# Patient Record
Sex: Male | Born: 1985 | Race: White | Hispanic: No | Marital: Single | Smoking: Never smoker
Health system: Southern US, Community
[De-identification: ages and names within clinical notes are randomized; demographics above are authoritative.]

## PROBLEM LIST (undated history)

## (undated) HISTORY — PX: TONSILLECTOMY: SUR1361

---

## 2008-04-21 ENCOUNTER — Emergency Department: Payer: Self-pay | Admitting: Emergency Medicine

## 2008-04-22 ENCOUNTER — Other Ambulatory Visit: Payer: Self-pay

## 2009-08-31 ENCOUNTER — Emergency Department: Payer: Self-pay | Admitting: Emergency Medicine

## 2011-03-31 ENCOUNTER — Emergency Department: Payer: Self-pay | Admitting: Internal Medicine

## 2011-05-13 ENCOUNTER — Emergency Department: Payer: Self-pay | Admitting: Emergency Medicine

## 2015-06-24 ENCOUNTER — Emergency Department
Admission: EM | Admit: 2015-06-24 | Discharge: 2015-06-25 | Disposition: A | Discharge status: Home / Self Care | Payer: Commercial Indemnity | Attending: Emergency Medicine | Admitting: Emergency Medicine

## 2015-06-24 DIAGNOSIS — Z79899 Other long term (current) drug therapy: Secondary | ICD-10-CM | POA: Insufficient documentation

## 2015-06-24 DIAGNOSIS — M549 Dorsalgia, unspecified: Secondary | ICD-10-CM | POA: Diagnosis not present

## 2015-06-24 DIAGNOSIS — R42 Dizziness and giddiness: Secondary | ICD-10-CM | POA: Diagnosis present

## 2015-06-24 LAB — BASIC METABOLIC PANEL
Anion gap: 8 (ref 5–15)
BUN: 16 mg/dL (ref 6–20)
CHLORIDE: 103 mmol/L (ref 101–111)
CO2: 28 mmol/L (ref 22–32)
Calcium: 9.6 mg/dL (ref 8.9–10.3)
Creatinine, Ser: 1.18 mg/dL (ref 0.61–1.24)
GFR calc Af Amer: >60 mL/min (ref >60)
GFR calc non Af Amer: >60 mL/min (ref >60)
GLUCOSE: 98 mg/dL (ref 65–99)
POTASSIUM: 3.7 mmol/L (ref 3.5–5.1)
Sodium: 139 mmol/L (ref 135–145)

## 2015-06-24 LAB — CBC
HEMATOCRIT: 44.4 % (ref 40.0–52.0)
HEMOGLOBIN: 15.3 g/dL (ref 13.0–18.0)
MCH: 30.1 pg (ref 26.0–34.0)
MCHC: 34.4 g/dL (ref 32.0–36.0)
MCV: 87.5 fL (ref 80.0–100.0)
Platelets: 314 10*3/uL (ref 150–440)
RBC: 5.08 MIL/uL (ref 4.40–5.90)
RDW: 12.8 % (ref 11.5–14.5)
WBC: 10.2 10*3/uL (ref 3.8–10.6)

## 2015-06-24 NOTE — ED Notes (Signed)
Pt to triage from [facility] with reports of light headedness x 1 week. Sluggish pupils and not eating well.

## 2015-06-24 NOTE — ED Notes (Signed)
REports being diagnosed with vertigo on Monday of this week. Denies "Dizziness" currently, c/o "lightheadedness". States meclizine is not helping.

## 2015-06-24 NOTE — ED Notes (Signed)
Pt states had felt lightheaded and dizzy for one week. Pt states has had nausea. "i just don't feel right and i'm not eating very well."

## 2015-06-25 ENCOUNTER — Emergency Department: Payer: Commercial Indemnity

## 2015-06-25 LAB — URINALYSIS COMPLETE WITH MICROSCOPIC (ARMC ONLY)
Bacteria, UA: NONE SEEN
Bilirubin Urine: NEGATIVE
Glucose, UA: NEGATIVE mg/dL
Hgb urine dipstick: NEGATIVE
KETONES UR: NEGATIVE mg/dL
Leukocytes, UA: NEGATIVE
Nitrite: NEGATIVE
PROTEIN: NEGATIVE mg/dL
SQUAMOUS EPITHELIAL / LPF: NONE SEEN
Specific Gravity, Urine: 1.021 (ref 1.005–1.030)
pH: 7 (ref 5.0–8.0)

## 2015-06-25 MED ORDER — IOHEXOL 350 MG/ML SOLN
80.0000 mL | Freq: Once | INTRAVENOUS | Status: AC | PRN
  Administered 2015-06-25: 80 mL via INTRAVENOUS

## 2015-06-25 NOTE — ED Notes (Signed)
Pt and spouse sleeping. resps unlabored. Vss. Call bella t side.

## 2015-06-25 NOTE — Discharge Instructions (Signed)
Dizziness °Dizziness is a common problem. It is a feeling of unsteadiness or light-headedness. You may feel like you are about to faint. Dizziness can lead to injury if you stumble or fall. A person of any age group can suffer from dizziness, but dizziness is more common in older adults. °CAUSES  °Dizziness can be caused by many different things, including: °· Middle ear problems. °· Standing for too long. °· Infections. °· An allergic reaction. °· Aging. °· An emotional response to something, such as the sight of blood. °· Side effects of medicines. °· Tiredness. °· Problems with circulation or blood pressure. °· Excessive use of alcohol or medicines, or illegal drug use. °· Breathing too fast (hyperventilation). °· An irregular heart rhythm (arrhythmia). °· A low red blood cell count (anemia). °· Pregnancy. °· Vomiting, diarrhea, fever, or other illnesses that cause body fluid loss (dehydration). °· Diseases or conditions such as Parkinson's disease, high blood pressure (hypertension), diabetes, and thyroid problems. °· Exposure to extreme heat. °DIAGNOSIS  °Your health care provider will ask about your symptoms, perform a physical exam, and perform an electrocardiogram (ECG) to record the electrical activity of your heart. Your health care provider may also perform other heart or blood tests to determine the cause of your dizziness. These may include: °· Transthoracic echocardiogram (TTE). During echocardiography, sound waves are used to evaluate how blood flows through your heart. °· Transesophageal echocardiogram (TEE). °· Cardiac monitoring. This allows your health care provider to monitor your heart rate and rhythm in real time. °· Holter monitor. This is a portable device that records your heartbeat and can help diagnose heart arrhythmias. It allows your health care provider to track your heart activity for several days if needed. °· Stress tests by exercise or by giving medicine that makes the heart beat  faster. °TREATMENT  °Treatment of dizziness depends on the cause of your symptoms and can vary greatly. °HOME CARE INSTRUCTIONS  °· Drink enough fluids to keep your urine clear or pale yellow. This is especially important in very hot weather. In older adults, it is also important in cold weather. °· Take your medicine exactly as directed if your dizziness is caused by medicines. When taking blood pressure medicines, it is especially important to get up slowly. °¨ Rise slowly from chairs and steady yourself until you feel okay. °¨ In the morning, first sit up on the side of the bed. When you feel okay, stand slowly while holding onto something until you know your balance is fine. °· Move your legs often if you need to stand in one place for a long time. Tighten and relax your muscles in your legs while standing. °· Have someone stay with you for 1-2 days if dizziness continues to be a problem. Do this until you feel you are well enough to stay alone. Have the person call your health care provider if he or she notices changes in you that are concerning. °· Do not drive or use heavy machinery if you feel dizzy. °· Do not drink alcohol. °SEEK IMMEDIATE MEDICAL CARE IF:  °· Your dizziness or light-headedness gets worse. °· You feel nauseous or vomit. °· You have problems talking, walking, or using your arms, hands, or legs. °· You feel weak. °· You are not thinking clearly or you have trouble forming sentences. It may take a friend or family member to notice this. °· You have chest pain, abdominal pain, shortness of breath, or sweating. °· Your vision changes. °· You notice   any bleeding.  You have side effects from medicine that seems to be getting worse rather than better. MAKE SURE YOU:   Understand these instructions.  Will watch your condition.  Will get help right away if you are not doing well or get worse. Document Released: 03/20/2001 Document Revised: 09/29/2013 Document Reviewed: 04/13/2011 [facility]  Patient Information 2015 [place], [place]. This information is not intended to replace advice given to you by your health care provider. Make sure you discuss any questions you have with your health care provider. Rest and take it easy this weekend. Drink plenty of fluids. Return for worsening symptoms especially any fever. Follow-up with your doctor if not any better in the next 3 or 4 days.

## 2015-06-25 NOTE — ED Provider Notes (Signed)
[facility] Dietrick Barris Note  ____________________________________________  Time seen: Approximately 1:05 AM  I have reviewed the triage vital signs and the nursing notes.   HISTORY  Chief Complaint Dizziness    HPI Tommy Morales is a 29 y.o. male who complains of unsteadiness when he stands. He was diagnosed at the urgent care for a understand with vertigo on Monday given meclizine but is not helping. Patient reports he had sudden onset of neck pain the back of the neck and then the lightheadedness and unsteadiness came on. He has most of the neck pain and unsteadiness when he sits or stands. It gets better when he lays down and he does report that it did get worse at least once when he was seen in the urgent care with head movement. Patient also was seen in critical clinic and had pain on examination of the right mastoid area. Patient reports he feels like there is water sloshing around his ear. Patient denies any fever nausea vomiting or other problems.  History reviewed. No pertinent past medical history.  There are no active problems to display for this patient.   No past surgical history on file.  Current Outpatient Rx  Name  Route  Sig  Dispense  Refill  . erythromycin ophthalmic ointment            0   . ondansetron (ZOFRAN-ODT) 8 MG disintegrating tablet      DIS 1 T ON THE TONGUE Q 8 H      0     Allergies Review of patient's allergies indicates not on file.  No family history on file.  Social History Social History  Substance Use Topics  . Smoking status: None  . Smokeless tobacco: None  . Alcohol Use: None    Review of Systems Constitutional: No fever/chills Eyes: No visual changes. ENT: No sore throat. Cardiovascular: Denies chest pain. Respiratory: Denies shortness of breath. Gastrointestinal: No abdominal pain.  No nausea, no vomiting.  No diarrhea.  No constipation. Genitourinary: Negative for  dysuria. Musculoskeletal: Negative for back pain. Skin: Negative for rash. Neurological: Negative for headaches, focal weakness or numbness.  10-point ROS otherwise negative.  ____________________________________________   PHYSICAL EXAM:  VITAL SIGNS: ED Triage Vitals  Enc Vitals Group     BP 06/24/15 1859 129/99 mmHg     Pulse Rate 06/24/15 1859 66     Resp 06/24/15 1859 18     Temp 06/24/15 1859 98.3 F (36.8 C)     Temp Source 06/24/15 1859 Oral     SpO2 06/24/15 1859 99 %     Weight 06/24/15 1859 193 lb (87.544 kg)     Height 06/24/15 1859  (1.854 m)     Head Cir --      Peak Flow --      Pain Score 06/24/15 2303 8     Pain Loc --      Pain Edu? --      Excl. in GC? --     Constitutional: Alert and oriented. Well appearing and in no acute distress. Eyes: Conjunctivae are normal. PERRL. EOMI. Head: Atraumatic. Nose: No congestion/rhinnorhea. Mouth/Throat: Mucous membranes are moist.  Oropharynx non-erythematous. Neck: No stridor No cervical spine tenderness to palpation. There is paraspinal tenderness. Hematological/Lymphatic/Immunilogical: No cervical lymphadenopathy. Cardiovascular: Normal rate, regular rhythm. Grossly normal heart sounds.  Good peripheral circulation. Respiratory: Normal respiratory effort.  No retractions. Lungs CTAB. Gastrointestinal: Soft and nontender. No distention. No abdominal bruits.  No CVA tenderness. Musculoskeletal: No lower extremity tenderness nor edema.  No joint effusions. Neurologic:  Normal speech and language. No gross focal neurologic deficits are appreciated. No gait instability visualized on the patient reports he feels very unsteady when he stands.. Cranial nerves II through XII are intact. Cerebellar finger-nose rapid alternating movements of the fingers and heel-to-shin are normal bilaterally. Motor strength is 5 over 5 throughout. Sensation is intact throughout. Skin:  Skin is warm, dry and intact. No rash  noted. Psychiatric: Mood and affect are normal. Speech and behavior are normal.  ____________________________________________   LABS (all labs ordered are listed, but only abnormal results are displayed)  Labs Reviewed  URINALYSIS COMPLETEWITH MICROSCOPIC ([facility] ONLY) - Abnormal; Notable for the following:    Color, Urine YELLOW (*)    APPearance CLEAR (*)    All other components within normal limits  BASIC METABOLIC PANEL  CBC   ____________________________________________  EKG  EKG read and interpreted by me shows normal sinus rhythm with a rate of 69. Normal axis essentially normal EKG ____________________________________________  RADIOLOGY  CT of the head and CT angiogram of the neck showed no acute pathology ____________________________________________   PROCEDURES    ____________________________________________   INITIAL IMPRESSION / ASSESSMENT AND PLAN / ED COURSE  Pertinent labs & imaging results that were available during my care of the patient were reviewed by me and considered in my medical decision making (see chart for details).  Discussed with patient and family at length the fact that all the tests I have are normal not quite sure what's going on with him please follow-up with his doctor return if he is worse ____________________________________________   FINAL CLINICAL IMPRESSION(S) / ED DIAGNOSES  Final diagnoses:  Dizziness      Arnaldo Natal, MD 06/25/15 [phone]

## 2015-06-25 NOTE — ED Notes (Signed)
Call bell at side. Warm blankets provided to visitors.

## 2015-07-07 ENCOUNTER — Other Ambulatory Visit: Payer: Self-pay | Admitting: Otolaryngology

## 2015-07-07 DIAGNOSIS — R42 Dizziness and giddiness: Secondary | ICD-10-CM

## 2015-07-14 ENCOUNTER — Ambulatory Visit
Admission: RE | Admit: 2015-07-14 | Discharge: 2015-07-14 | Disposition: A | Discharge status: Home / Self Care | Payer: Commercial Indemnity | Source: Ambulatory Visit | Attending: Otolaryngology | Admitting: Otolaryngology

## 2015-07-14 DIAGNOSIS — R42 Dizziness and giddiness: Secondary | ICD-10-CM | POA: Insufficient documentation

## 2015-07-14 MED ORDER — GADOBENATE DIMEGLUMINE 529 MG/ML IV SOLN
15.0000 mL | Freq: Once | INTRAVENOUS | Status: AC | PRN
  Administered 2015-07-14: 15 mL via INTRAVENOUS

## 2015-09-12 ENCOUNTER — Ambulatory Visit: Payer: Commercial Indemnity | Admitting: Neurology

## 2015-09-26 ENCOUNTER — Ambulatory Visit: Payer: Commercial Indemnity | Admitting: Neurology

## 2016-06-12 IMAGING — MR MR BRAIN/IAC WO/W
7 of 11 series · 33 of 48 positions shown · IV contrast (multihance)
Comparison: 06/25/2015 head and neck CTA

CLINICAL DATA: Dizziness. Posterior headaches, lightheadedness, and
vertigo for 3-4 weeks.

EXAM:
MR BRAIN/IAC WITHOUT AND WITH CONTRAST
TECHNIQUE: Multiplanar, multisequence MR imaging was performed both before and
after administration of intravenous contrast.
CONTRAST:  15mL MULTIHANCE GADOBENATE DIMEGLUMINE 529 MG/ML IV SOLN

[Series 2: T1 · sagittal · 5.0mm · 0.45mm/px · 5 of 29 slices shown (1 of 2)]
[im 1/29]
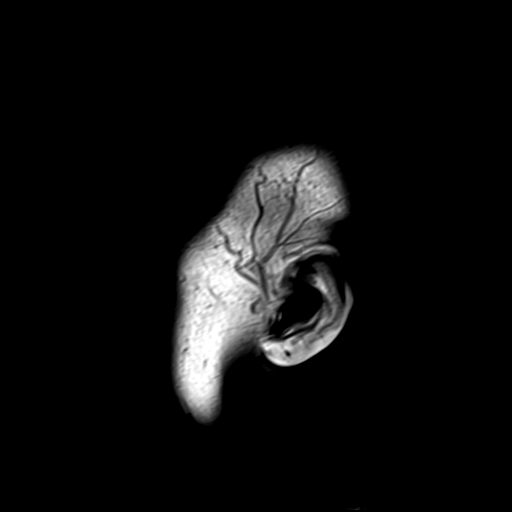
[im 8/29]
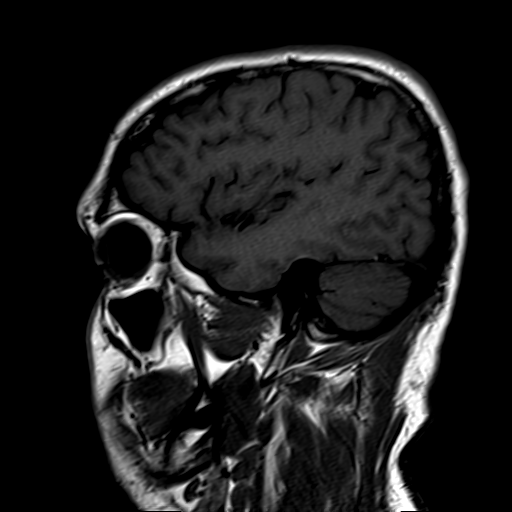
[im 15/29]
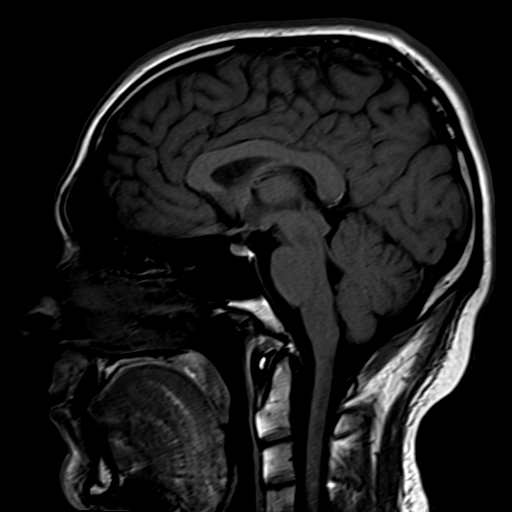
[im 22/29]
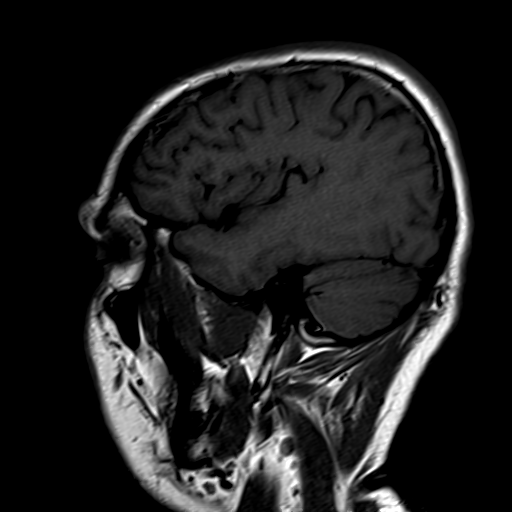
[im 29/29]
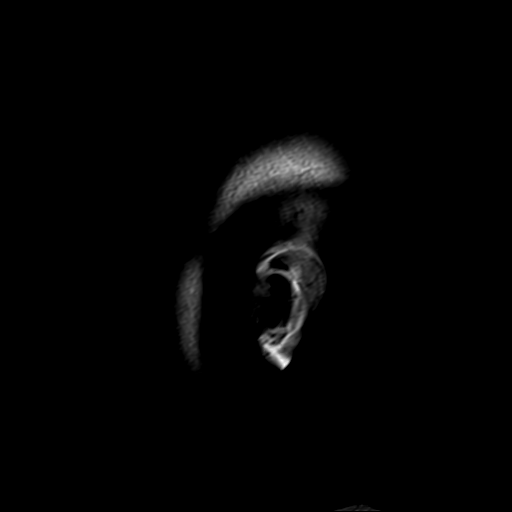

[Series 4: DWI · axial · 3.0mm · 1.80mm/px · z∈[-61,+98]mm · 6 of 41 slices shown (1 of 2)]
[im 1/41]
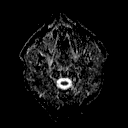
[im 9/41]
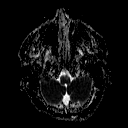
[im 17/41]
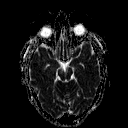
[im 25/41]
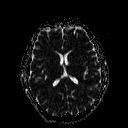
[im 33/41]
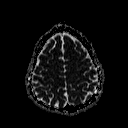
[im 41/41]
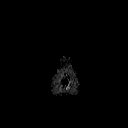

[Series 5: T2 · axial · 5.0mm · 0.60mm/px · z∈[-65,+103]mm · 4 of 27 slices shown (1 of 2)]
[im 1/27]
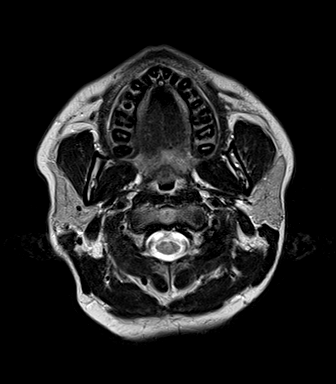
[im 9/27]
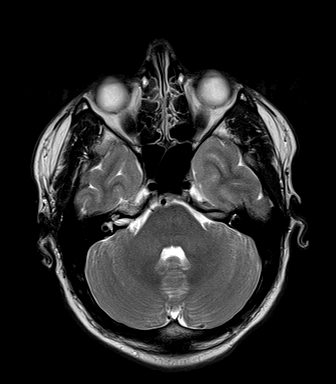
[im 18/27]
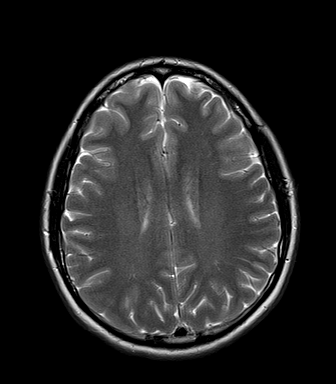
[im 27/27]
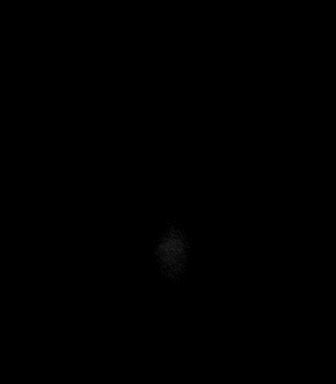

[Series 6: FLAIR · axial · 5.0mm · 0.45mm/px · z∈[-65,+103]mm · 4 of 27 slices shown]
[im 1/27]
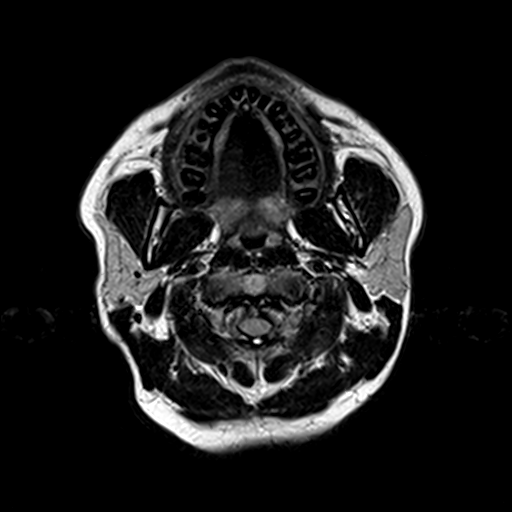
[im 9/27]
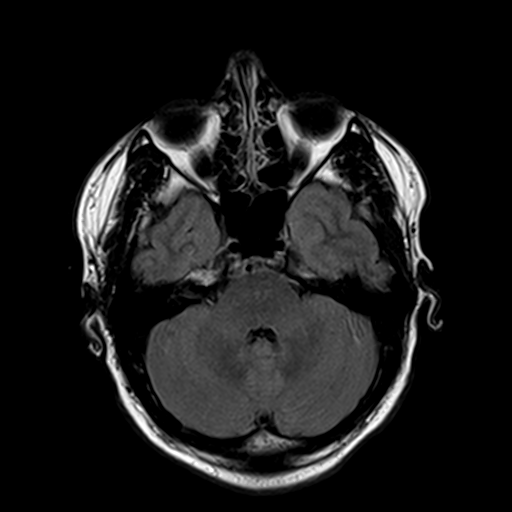
[im 18/27]
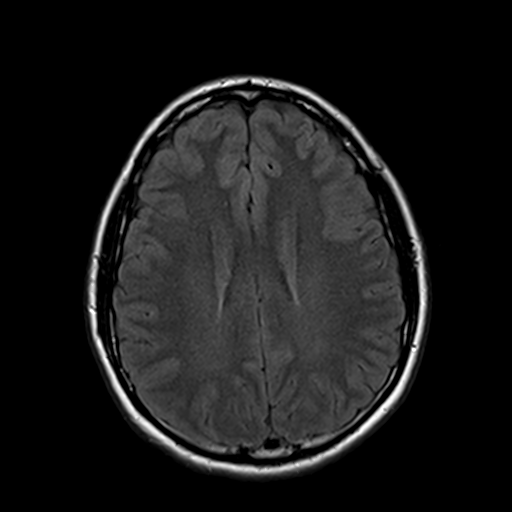
[im 27/27]
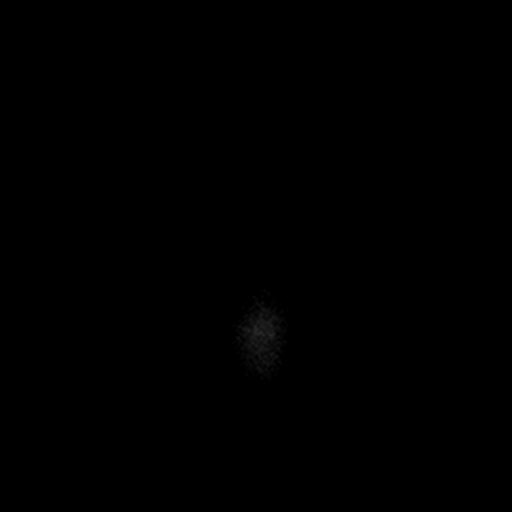

[Series 7: T1 · coronal · 3.0mm · 0.78mm/px · 2 of 15 slices shown (2 of 2)]
[im 1/15]
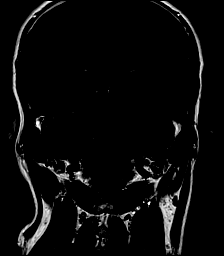
[im 15/15]
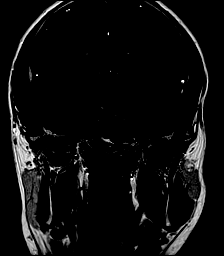

[Series 9: T2 · axial · 1.0mm · 0.35mm/px · z∈[-32,+6]mm · 6 of 40 slices shown (2 of 2)]
[im 1/40]
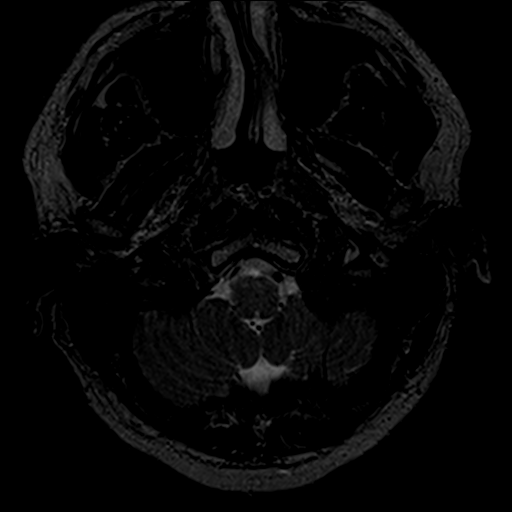
[im 8/40]
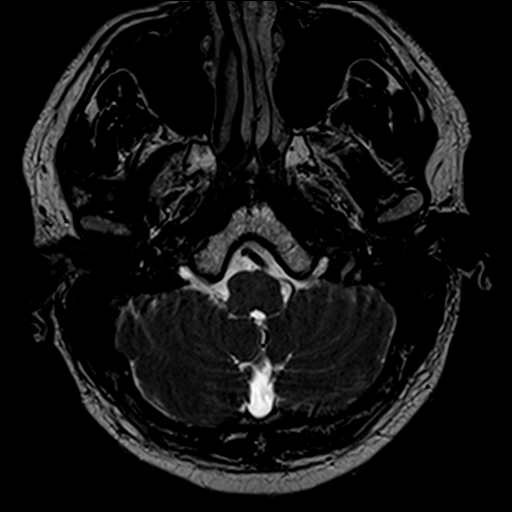
[im 16/40]
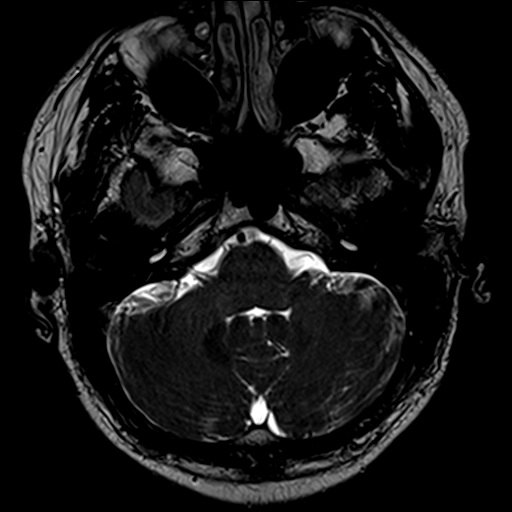
[im 24/40]
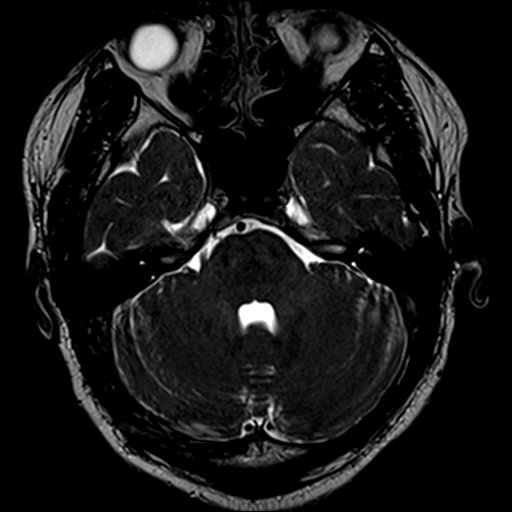
[im 32/40]
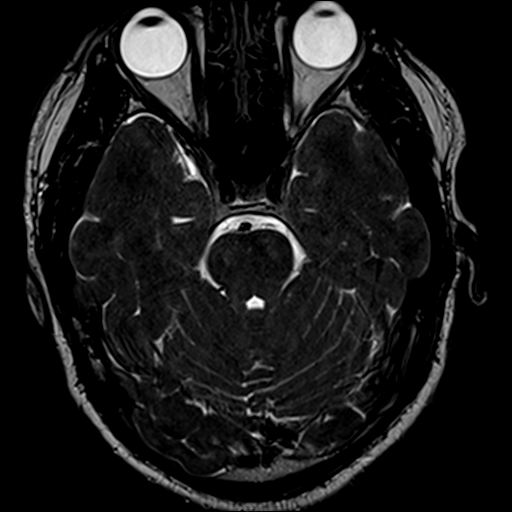
[im 40/40]
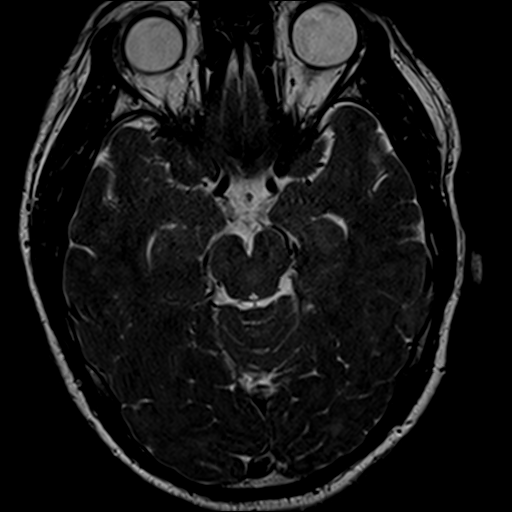

[Series 100: DWI · axial · 3.0mm · 1.80mm/px · z∈[-61,+98]mm · 6 of 42 slices shown (2 of 2)]
[im 1/42]
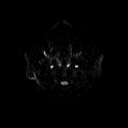
[im 9/42]
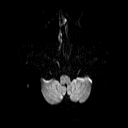
[im 17/42]
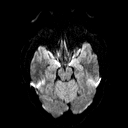
[im 25/42]
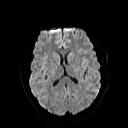
[im 33/42]
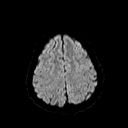
[im 42/42]
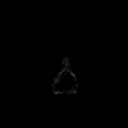

[33 of 48 positions shown; findings below may reference images not displayed]

FINDINGS: There is no acute infarct. Ventricles and sulci are normal for age.
There is no evidence of intracranial hemorrhage, mass, midline
shift, or extra-axial fluid collection. No brain parenchymal signal
abnormality or abnormal enhancement is identified.

Orbits are unremarkable. Paranasal sinuses and mastoid air cells are
clear. Major intracranial vascular flow voids are preserved.
Calvarium and scalp soft tissues are unremarkable.

Dedicated imaging through the internal auditory canals demonstrates
a normal course of cranial nerves VII and VIII without evidence of
mass or abnormal enhancement. Inner ear structures demonstrate
normal signal bilaterally. No mass is seen within the
cerebellopontine angles.
IMPRESSION: Unremarkable MRI of the brain and internal auditory canals.

## 2017-08-07 ENCOUNTER — Encounter: Payer: Self-pay | Admitting: *Deleted

## 2017-08-07 ENCOUNTER — Ambulatory Visit
Admission: EM | Admit: 2017-08-07 | Discharge: 2017-08-07 | Disposition: A | Discharge status: Home / Self Care | Payer: 59 | Attending: Family Medicine | Admitting: Family Medicine

## 2017-08-07 DIAGNOSIS — R05 Cough: Secondary | ICD-10-CM | POA: Diagnosis not present

## 2017-08-07 DIAGNOSIS — J209 Acute bronchitis, unspecified: Secondary | ICD-10-CM | POA: Diagnosis not present

## 2017-08-07 MED ORDER — HYDROCOD POLST-CPM POLST ER 10-8 MG/5ML PO SUER: 5.0000 mL | Freq: Two times a day (BID) | ORAL | 0 refills | Status: DC | PRN

## 2017-08-07 MED ORDER — PREDNISONE 50 MG PO TABS: ORAL_TABLET | ORAL | 0 refills | Status: DC

## 2017-08-07 NOTE — ED Triage Notes (Signed)
C/O having a cough for over a month after having flu like symptoms

## 2017-08-07 NOTE — ED Provider Notes (Signed)
[facility]    CSN: [id number] Arrival date & time: 08/07/17  1113     History   Chief Complaint Chief Complaint  Patient presents with  . Cough   HPI  31 year old male presents with cough.  Cough x 3 weeks. Was productive but now dry. Mild in severity. Associated SOB and chest tightness/pain. He's been using OTC medication with some improvement. No known exacerbating factors. No other associated symptoms. No other complaints at this time.   History reviewed. No pertinent past medical history.  Past Surgical History:  Procedure Laterality Date  . TONSILLECTOMY     Home Medications    Prior to Admission medications   Medication Sig Start Date End Date Taking? Authorizing Provider  chlorpheniramine-HYDROcodone (TUSSIONEX PENNKINETIC ER) 10-8 MG/5ML SUER Take 5 mLs by mouth every 12 (twelve) hours as needed. 08/07/17   Tommie Samsook, Mak Bonny G, DO  predniSONE (DELTASONE) 50 MG tablet 1 tablet daily x 5 days. 08/07/17   Tommie Samsook, Andreia Gandolfi G, DO   Family History History reviewed. No pertinent family history.  Social History Social History  Substance Use Topics  . Smoking status: Never Smoker  . Smokeless tobacco: Never Used  . Alcohol use No   Allergies   Patient has no allergy information on record.   Review of Systems Review of Systems  Constitutional: Negative.   Respiratory: Positive for cough, chest tightness and shortness of breath.    Physical Exam Triage Vital Signs ED Triage Vitals  Enc Vitals Group     BP 08/07/17 1133 133/74     Pulse Rate 08/07/17 1133 80     Resp 08/07/17 1133 14     Temp 08/07/17 1133 97.8 F (36.6 C)     Temp Source 08/07/17 1133 Oral     SpO2 08/07/17 1133 97 %     Weight 08/07/17 1135 192 lb (87.1 kg)     Height 08/07/17 1135 6\' 1"  (1.854 m)     Head Circumference --      Peak Flow --      Pain Score 08/07/17 1159 3     Pain Loc --      Pain Edu? --      Excl. in GC? --    Updated Vital Signs BP 133/74 (BP Location:  Left Arm)   Pulse 80   Temp 97.8 F (36.6 C) (Oral)   Resp 14   Ht 6\' 1"  (1.854 m)   Wt 192 lb (87.1 kg)   SpO2 97%   BMI 25.33 kg/m   Physical Exam  Constitutional: He is oriented to person, place, and time. He appears well-developed. No distress.  HENT:  Head: Normocephalic and atraumatic.  Mouth/Throat: Oropharynx is clear and moist.  Eyes: Conjunctivae are normal. No scleral icterus.  Neck: Neck supple.  Cardiovascular: Normal rate and regular rhythm.   No murmur heard. Pulmonary/Chest: Effort normal and breath sounds normal. He has no wheezes. He has no rales.  Lymphadenopathy:    He has no cervical adenopathy.  Neurological: He is alert and oriented to person, place, and time.  Skin: Skin is warm. No rash noted.  Psychiatric: He has a normal mood and affect.  Vitals reviewed.   UC Treatments / Results  Labs (all labs ordered are listed, but only abnormal results are displayed) Labs Reviewed - No data to display  EKG  EKG Interpretation None       Radiology No results found.  Procedures Procedures (including critical care time)  Medications Ordered  in UC Medications - No data to display   Initial Impression / Assessment and Plan / UC Course  I have reviewed the triage vital signs and the nursing notes.  Pertinent labs & imaging results that were available during my care of the patient were reviewed by me and considered in my medical decision making (see chart for details).     30 year old male presents with cough. Appears to have bronchitis. Treating with prednisone and tussionex.  Final Clinical Impressions(s) / UC Diagnoses   Final diagnoses:  Acute bronchitis, unspecified organism    New Prescriptions Discharge Medication List as of 08/07/2017 11:56 AM    START taking these medications   Details  chlorpheniramine-HYDROcodone (TUSSIONEX PENNKINETIC ER) 10-8 MG/5ML SUER Take 5 mLs by mouth every 12 (twelve) hours as needed., Starting Wed  08/07/2017, Print    predniSONE (DELTASONE) 50 MG tablet 1 tablet daily x 5 days., Normal       Controlled Substance Prescriptions [place] Controlled Substance Registry consulted? Not Applicable   Tommie Sams, DO 08/07/17 1222

## 2017-08-07 NOTE — Discharge Instructions (Signed)
Use the cough medication at night.  Prednisone (steroid) will help resolve this sooner.  Take care  Dr. Adriana Simasook

## 2021-08-23 ENCOUNTER — Ambulatory Visit
Admission: EM | Admit: 2021-08-23 | Discharge: 2021-08-23 | Disposition: A | Discharge status: Home / Self Care | Payer: BC Managed Care – PPO | Attending: Medical Oncology | Admitting: Medical Oncology

## 2021-08-23 ENCOUNTER — Other Ambulatory Visit: Payer: Self-pay

## 2021-08-23 DIAGNOSIS — H66003 Acute suppurative otitis media without spontaneous rupture of ear drum, bilateral: Secondary | ICD-10-CM | POA: Diagnosis not present

## 2021-08-23 DIAGNOSIS — J0100 Acute maxillary sinusitis, unspecified: Secondary | ICD-10-CM

## 2021-08-23 DIAGNOSIS — R051 Acute cough: Secondary | ICD-10-CM

## 2021-08-23 MED ORDER — BENZONATATE 100 MG PO CAPS: 100.0000 mg | ORAL_CAPSULE | Freq: Three times a day (TID) | ORAL | 0 refills | Status: DC

## 2021-08-23 MED ORDER — FLUTICASONE PROPIONATE 50 MCG/ACT NA SUSP: 2.0000 | Freq: Every day | NASAL | 0 refills | Status: AC

## 2021-08-23 MED ORDER — AMOXICILLIN-POT CLAVULANATE 875-125 MG PO TABS: 1.0000 | ORAL_TABLET | Freq: Two times a day (BID) | ORAL | 0 refills | Status: DC

## 2021-08-23 NOTE — ED Provider Notes (Signed)
[facility]    CSN: [id number] Arrival date & time: 08/23/21  1828      History   Chief Complaint Chief Complaint  Patient presents with   Sore Throat   Nasal Congestion    HPI Tommy Morales is a 35 y.o. male.   HPI  Nasal congestion: Patient reports that about 3 weeks ago he started to have symptoms of a cough, sore throat and nasal congestion.  Cough and sore throat have improved but the nasal congestion has worsened.  Nasal congestion is worse on the left side and now he is having some dental pain as well on the entire left upper side of his teeth.  Now also having some ear pain.  No fevers, shortness of breath, chest pain.  He has tried Mucinex, Tylenol and ibuprofen for symptoms without much relief.  History reviewed. No pertinent past medical history.  There are no problems to display for this patient.   Past Surgical History:  Procedure Laterality Date   TONSILLECTOMY         Home Medications    Prior to Admission medications   Medication Sig Start Date End Date Taking? Authorizing Provider  chlorpheniramine-HYDROcodone (TUSSIONEX PENNKINETIC ER) 10-8 MG/5ML SUER Take 5 mLs by mouth every 12 (twelve) hours as needed. 08/07/17   Tommie Sams, DO  predniSONE (DELTASONE) 50 MG tablet 1 tablet daily x 5 days. 08/07/17   Tommie Sams, DO    Family History History reviewed. No pertinent family history.  Social History Social History   Tobacco Use   Smoking status: Never   Smokeless tobacco: Never  Substance Use Topics   Alcohol use: No   Drug use: No     Allergies   Patient has no known allergies.   Review of Systems Review of Systems  As stated above in HPI Physical Exam Triage Vital Signs ED Triage Vitals  Enc Vitals Group     BP 08/23/21 1930 (!) 145/98     Pulse Rate 08/23/21 1930 88     Resp 08/23/21 1930 16     Temp 08/23/21 1930 98.1 F (36.7 C)     Temp Source 08/23/21 1930 Oral     SpO2 08/23/21 1930 99 %      Weight --      Height --      Head Circumference --      Peak Flow --      Pain Score 08/23/21 1928 5     Pain Loc --      Pain Edu? --      Excl. in GC? --    No data found.  Updated Vital Signs BP (!) 145/98 (BP Location: Right Arm)   Pulse 88   Temp 98.1 F (36.7 C) (Oral)   Resp 16   SpO2 99%   Physical Exam Vitals and nursing note reviewed.  Constitutional:      General: He is not in acute distress.    Appearance: He is well-developed. He is not ill-appearing, toxic-appearing or diaphoretic.  HENT:     Head: Normocephalic and atraumatic.     Right Ear: A middle ear effusion is present. Tympanic membrane is erythematous.     Left Ear: A middle ear effusion is present. Tympanic membrane is erythematous.     Nose: Congestion (Left sinus bogginess and tenderness. Deviated septum) present.     Mouth/Throat:     Mouth: Mucous membranes are moist.     Pharynx: Oropharynx  is clear. No oropharyngeal exudate or posterior oropharyngeal erythema.     Tonsils: No tonsillar exudate.  Eyes:     Conjunctiva/sclera: Conjunctivae normal.     Pupils: Pupils are equal, round, and reactive to light.  Cardiovascular:     Rate and Rhythm: Normal rate and regular rhythm.     Heart sounds: Normal heart sounds.  Pulmonary:     Effort: Pulmonary effort is normal.     Breath sounds: Normal breath sounds.  Musculoskeletal:     Cervical back: Normal range of motion and neck supple.  Lymphadenopathy:     Cervical: Cervical adenopathy present.  Skin:    General: Skin is warm.  Neurological:     Mental Status: He is alert and oriented to person, place, and time.     UC Treatments / Results  Labs (all labs ordered are listed, but only abnormal results are displayed) Labs Reviewed - No data to display  EKG   Radiology No results found.  Procedures Procedures (including critical care time)  Medications Ordered in UC Medications - No data to display  Initial Impression /  Assessment and Plan / UC Course  I have reviewed the triage vital signs and the nursing notes.  Pertinent labs & imaging results that were available during my care of the patient were reviewed by me and considered in my medical decision making (see chart for details).     New.  Treating for acute sinusitis and acute otitis media with Augmentin along with Flonase and Tessalon for cough.  I discussed the sinus rinse as well.  Red flag signs and symptoms.  Follow-up as needed. Final Clinical Impressions(s) / UC Diagnoses   Final diagnoses:  None   Discharge Instructions   None    ED Prescriptions   None    PDMP not reviewed this encounter.   Rushie Chestnut, [place] 08/23/21 1958

## 2021-08-23 NOTE — ED Triage Notes (Signed)
Patient presents to Urgent Care with complaints of sore throat, nasal congestion, bilateral ear pressure, headache, and facial pressure x 2 weeks. Pt is concerned with sinus infection. Has a hx of sinus infections. Treating with mucinex, tylenol, and ibuprofen.   Denies fever.

## 2021-09-28 ENCOUNTER — Ambulatory Visit
Admission: EM | Admit: 2021-09-28 | Discharge: 2021-09-28 | Disposition: A | Discharge status: Home / Self Care | Payer: BC Managed Care – PPO | Attending: Emergency Medicine | Admitting: Emergency Medicine

## 2021-09-28 ENCOUNTER — Encounter: Payer: Self-pay | Admitting: Emergency Medicine

## 2021-09-28 ENCOUNTER — Other Ambulatory Visit: Payer: Self-pay

## 2021-09-28 DIAGNOSIS — H66004 Acute suppurative otitis media without spontaneous rupture of ear drum, recurrent, right ear: Secondary | ICD-10-CM | POA: Diagnosis not present

## 2021-09-28 DIAGNOSIS — H6121 Impacted cerumen, right ear: Secondary | ICD-10-CM | POA: Diagnosis not present

## 2021-09-28 MED ORDER — IBUPROFEN 600 MG PO TABS: 600.0000 mg | ORAL_TABLET | Freq: Four times a day (QID) | ORAL | 0 refills | Status: AC | PRN

## 2021-09-28 MED ORDER — CEFDINIR 300 MG PO CAPS: 300.0000 mg | ORAL_CAPSULE | Freq: Two times a day (BID) | ORAL | 0 refills | Status: AC

## 2021-09-28 NOTE — Discharge Instructions (Addendum)
Finish the [place], even if you feel better.  Continue the Flonase.  May take 600 mg of ibuprofen, 1000 mg of Tylenol together 3-4 times a day as needed for pain.  Follow-up with Dr. Jenne Campus because this is a recurrent issue.  Here is a list of primary care providers who are taking new patients:  Dr. Elizabeth Sauer [address] [place] [address] [phone]  [facility] [address] [place], [place] [address] [phone]  [facility] [place] [address] [place] [address]  [phone]  [facility] [address] [place], [place] [address] [phone]  [facility] [address] [place]  [phone] [place], [place] [address]  Here are clinics/ other resources who will see you if you do not have insurance. Some have certain criteria that you must meet. Call them and find out what they are:  [facility]: [address], [place], [place] [address] Phone: [phone] Hours: First and Third Saturdays of each Month, 9 a.m. - 1 p.m.  [facility]: [address] [place], [place] [address] Phone: [phone] Hours: Tuesday, 4 p.m. - 8 p.m. Thursday, 1 p.m. - 8 p.m. Wednesday, 9 a.m. - [facility] [address] [place], [place] [address] Phone: [phone] Pharmacy Phone Number: [phone] Dental Phone Number: [phone] [facility] Insurance Help: [phone]  Dental Hours: Monday - Thursday, 8 a.m. - 6 p.m.  [facility] [address], [place], [place] [address] Phone: [phone] Pharmacy Phone Number: [phone] [facility] Insurance Help: [phone]  [facility] [address] [place] [place], [place] [address] Phone: [phone] Pharmacy Phone Number: [phone] [facility] Insurance Help: [phone]  [facility] [address] [place], [place] [address] Phone: [phone] [facility] Insurance Help: [phone]    [facility] [address] [place], [place] [address] Phone: [phone]  Go to www.goodrx.com  or www.costplusdrugs.com to look up your medications. This will give you a list of where you can find your prescriptions at the most affordable prices. Or ask the pharmacist what the cash price is, or if they have any other discount programs available to help make your medication more affordable. This can be less expensive than what you would pay with insurance.

## 2021-09-28 NOTE — ED Triage Notes (Signed)
Pt presents today with c/o of right ear pain. He was seen here for same on 11/16. He completed course of antibiotics, but pain to right ear returned. Pain 8/10. No meds pta. Declined Tylenol in triage.

## 2021-09-28 NOTE — ED Provider Notes (Signed)
HPI  SUBJECTIVE:  Tommy Morales is a 35 y.o. male who presents with 2 days of right ear pain, right-sided sore throat, tinnitus, ear popping.  No fevers, nasal congestion, rhinorrhea, sinus pain or pressure, postnasal drip, change in hearing, otorrhea, vertigo, dizziness.  No allergy or GERD symptoms.  No drooling, trismus, voice changes, sensation of throat swelling shut, neck stiffness.  No antipyretic in the past 6 hours.  He finished Augmentin for a right-sided otitis media 3 weeks ago.  He has tried Alka-Seltzer and Claritin without improvement in his symptoms.  Symptoms worse with opening his mouth and swallowing.  He states his symptoms are identical to his recent episode of otitis media in November.  He has no past medical history.  PMD: None.  History reviewed. No pertinent past medical history.  Past Surgical History:  Procedure Laterality Date   TONSILLECTOMY      History reviewed. No pertinent family history.  Social History   Tobacco Use   Smoking status: Never   Smokeless tobacco: Never  Substance Use Topics   Alcohol use: No   Drug use: No    No current facility-administered medications for this encounter.  Current Outpatient Medications:    cefdinir (OMNICEF) 300 MG capsule, Take 1 capsule (300 mg total) by mouth 2 (two) times daily for 10 days., Disp: 20 capsule, Rfl: 0   ibuprofen (ADVIL) 600 MG tablet, Take 1 tablet (600 mg total) by mouth every 6 (six) hours as needed., Disp: 30 tablet, Rfl: 0   fluticasone (FLONASE) 50 MCG/ACT nasal spray, Place 2 sprays into both nostrils daily., Disp: 16 mL, Rfl: 0  No Known Allergies   ROS  As noted in HPI.   Physical Exam  BP 133/84 (BP Location: Right Arm)    Pulse 82    Temp 98.8 F (37.1 C) (Oral)    Resp 16    SpO2 96%   Constitutional: Well developed, well nourished, no acute distress Eyes:  EOMI, conjunctiva normal bilaterally HENT: Normocephalic, atraumatic,mucus membranes moist.  Right external ear, EAC  normal.  TM obscured by cerumen.  No pain with traction on pinna, palpation of tragus, palpation of mastoid.  Decreased hearing right ear compared to left.  No tenderness, crepitus at the TMJ bilaterally.  Left TM normal.  Positive nasal congestion on the right side.  No maxillary, frontal sinus tenderness.  Normal oropharynx, tonsils surgically absent, uvula midline. Neck: No cervical lymphadenopathy Respiratory: Normal inspiratory effort Cardiovascular: Normal rate GI: nondistended skin: No rash, skin intact Musculoskeletal: no deformities Neurologic: Alert & oriented x 3, no focal neuro deficits Psychiatric: Speech and behavior appropriate   ED Course   Medications - No data to display  No orders of the defined types were placed in this encounter.   No results found for this or any previous visit (from the past 24 hour(s)). No results found.  ED Clinical Impression  1. Recurrent acute suppurative otitis media of right ear without spontaneous rupture of tympanic membrane      ED Assessment/Plan   Unable to visualize TM due to cerumen impaction. will have this irrigated and reevaluate.  Post irrigation, patient's right TM is dull, erythematous and bulging.  Patient with recurrent right-sided otitis media.  Sending home with Omnicef 300 mg p.o. twice daily for 10 days. will have him follow-up with Dr. Jenne Campus, ENT on-call, since this is recurrent.  Providing primary care list and will order assistance in finding a PMD.    Discussed l MDM,  treatment plan, and plan for follow-up with patient.  patient agrees with plan.   Meds ordered this encounter  Medications   cefdinir (OMNICEF) 300 MG capsule    Sig: Take 1 capsule (300 mg total) by mouth 2 (two) times daily for 10 days.    Dispense:  20 capsule    Refill:  0   ibuprofen (ADVIL) 600 MG tablet    Sig: Take 1 tablet (600 mg total) by mouth every 6 (six) hours as needed.    Dispense:  30 tablet    Refill:  0       *This clinic note was created using Scientist, clinical (histocompatibility and immunogenetics). Therefore, there may be occasional mistakes despite careful proofreading.  ?    Domenick Gong, MD 09/29/21 [phone]
